# Patient Record
Sex: Female | Born: 1995 | Race: White | Hispanic: No | Marital: Single | Smoking: Current every day smoker
Health system: Southern US, Community
[De-identification: ages and names within clinical notes are randomized; demographics above are authoritative.]

## PROBLEM LIST (undated history)

## (undated) DIAGNOSIS — F319 Bipolar disorder, unspecified: Secondary | ICD-10-CM

---

## 2016-10-20 ENCOUNTER — Emergency Department (HOSPITAL_COMMUNITY)
Admission: EM | Admit: 2016-10-20 | Discharge: 2016-10-21 | Disposition: A | Discharge status: Home / Self Care | Payer: Self-pay | Attending: Emergency Medicine | Admitting: Emergency Medicine

## 2016-10-20 ENCOUNTER — Emergency Department (HOSPITAL_COMMUNITY): Payer: Self-pay

## 2016-10-20 ENCOUNTER — Encounter (HOSPITAL_COMMUNITY): Payer: Self-pay | Admitting: Nurse Practitioner

## 2016-10-20 DIAGNOSIS — F17200 Nicotine dependence, unspecified, uncomplicated: Secondary | ICD-10-CM | POA: Insufficient documentation

## 2016-10-20 DIAGNOSIS — S0990XA Unspecified injury of head, initial encounter: Secondary | ICD-10-CM

## 2016-10-20 DIAGNOSIS — S098XXA Other specified injuries of head, initial encounter: Secondary | ICD-10-CM | POA: Insufficient documentation

## 2016-10-20 DIAGNOSIS — S0012XA Contusion of left eyelid and periocular area, initial encounter: Secondary | ICD-10-CM | POA: Insufficient documentation

## 2016-10-20 DIAGNOSIS — Y999 Unspecified external cause status: Secondary | ICD-10-CM | POA: Insufficient documentation

## 2016-10-20 DIAGNOSIS — M25512 Pain in left shoulder: Secondary | ICD-10-CM | POA: Insufficient documentation

## 2016-10-20 DIAGNOSIS — R0789 Other chest pain: Secondary | ICD-10-CM | POA: Insufficient documentation

## 2016-10-20 DIAGNOSIS — R1084 Generalized abdominal pain: Secondary | ICD-10-CM | POA: Insufficient documentation

## 2016-10-20 DIAGNOSIS — Y939 Activity, unspecified: Secondary | ICD-10-CM | POA: Insufficient documentation

## 2016-10-20 DIAGNOSIS — Y929 Unspecified place or not applicable: Secondary | ICD-10-CM | POA: Insufficient documentation

## 2016-10-20 DIAGNOSIS — M542 Cervicalgia: Secondary | ICD-10-CM | POA: Insufficient documentation

## 2016-10-20 DIAGNOSIS — M25511 Pain in right shoulder: Secondary | ICD-10-CM | POA: Insufficient documentation

## 2016-10-20 DIAGNOSIS — S0512XA Contusion of eyeball and orbital tissues, left eye, initial encounter: Secondary | ICD-10-CM

## 2016-10-20 DIAGNOSIS — R55 Syncope and collapse: Secondary | ICD-10-CM | POA: Insufficient documentation

## 2016-10-20 HISTORY — DX: Bipolar disorder, unspecified: F31.9

## 2016-10-20 MED ORDER — ONDANSETRON HCL 4 MG/2ML IJ SOLN
4.0000 mg | Freq: Once | INTRAMUSCULAR | Status: AC
  Administered 2016-10-21: 4 mg via INTRAVENOUS
  Filled 2016-10-20: qty 2

## 2016-10-20 MED ORDER — FENTANYL CITRATE (PF) 100 MCG/2ML IJ SOLN
50.0000 ug | Freq: Once | INTRAMUSCULAR | Status: AC
  Administered 2016-10-21: 50 ug via INTRAVENOUS
  Filled 2016-10-20: qty 2
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
## 2016-10-21 ENCOUNTER — Emergency Department (HOSPITAL_COMMUNITY): Payer: Self-pay

## 2016-10-21 LAB — CBC
HCT: 40.5 % (ref 36.0–46.0)
Hemoglobin: 13.7 g/dL (ref 12.0–15.0)
MCH: 29.5 pg (ref 26.0–34.0)
MCHC: 33.8 g/dL (ref 30.0–36.0)
MCV: 87.1 fL (ref 78.0–100.0)
PLATELETS: 238 10*3/uL (ref 150–400)
RBC: 4.65 MIL/uL (ref 3.87–5.11)
RDW: 13.4 % (ref 11.5–15.5)
WBC: 15.4 10*3/uL — AB (ref 4.0–10.5)

## 2016-10-21 LAB — COMPREHENSIVE METABOLIC PANEL
ALBUMIN: 4.6 g/dL (ref 3.5–5.0)
ALT: 18 U/L (ref 14–54)
AST: 27 U/L (ref 15–41)
Alkaline Phosphatase: 64 U/L (ref 38–126)
Anion gap: 15 (ref 5–15)
BUN: 9 mg/dL (ref 6–20)
CALCIUM: 8.9 mg/dL (ref 8.9–10.3)
CO2: 18 mmol/L — ABNORMAL LOW (ref 22–32)
CREATININE: 0.76 mg/dL (ref 0.44–1.00)
Chloride: 107 mmol/L (ref 101–111)
GFR calc Af Amer: >60 mL/min (ref >60)
GFR calc non Af Amer: >60 mL/min (ref >60)
Glucose, Bld: 90 mg/dL (ref 65–99)
Potassium: 3.5 mmol/L (ref 3.5–5.1)
SODIUM: 140 mmol/L (ref 135–145)
TOTAL PROTEIN: 7 g/dL (ref 6.5–8.1)
Total Bilirubin: 0.4 mg/dL (ref 0.3–1.2)

## 2016-10-21 LAB — URINALYSIS, ROUTINE W REFLEX MICROSCOPIC
BILIRUBIN URINE: NEGATIVE
Glucose, UA: NEGATIVE mg/dL
Hgb urine dipstick: NEGATIVE
KETONES UR: 5 mg/dL — AB
Leukocytes, UA: NEGATIVE
NITRITE: NEGATIVE
PH: 6 (ref 5.0–8.0)
PROTEIN: NEGATIVE mg/dL
Specific Gravity, Urine: 1.019 (ref 1.005–1.030)

## 2016-10-21 LAB — PROTIME-INR
INR: 1.03
Prothrombin Time: 13.5 seconds (ref 11.4–15.2)

## 2016-10-21 LAB — I-STAT CHEM 8, ED
BUN: 10 mg/dL (ref 6–20)
CALCIUM ION: 0.95 mmol/L — AB (ref 1.15–1.40)
Chloride: 111 mmol/L (ref 101–111)
Creatinine, Ser: 0.8 mg/dL (ref 0.44–1.00)
Glucose, Bld: 95 mg/dL (ref 65–99)
HCT: 41 % (ref 36.0–46.0)
HEMOGLOBIN: 13.9 g/dL (ref 12.0–15.0)
Potassium: 3.5 mmol/L (ref 3.5–5.1)
SODIUM: 143 mmol/L (ref 135–145)
TCO2: 19 mmol/L — AB (ref 22–32)

## 2016-10-21 LAB — I-STAT CG4 LACTIC ACID, ED
Lactic Acid, Venous: 1.84 mmol/L (ref 0.5–1.9)
Lactic Acid, Venous: 3.34 mmol/L (ref 0.5–1.9)

## 2016-10-21 LAB — I-STAT BETA HCG BLOOD, ED (MC, WL, AP ONLY)

## 2016-10-21 LAB — ETHANOL: ALCOHOL ETHYL (B): 55 mg/dL — AB (ref <5)

## 2016-10-21 MED ORDER — ONDANSETRON 4 MG PO TBDP: 4.0000 mg | ORAL_TABLET | Freq: Three times a day (TID) | ORAL | 0 refills | Status: AC | PRN

## 2016-10-21 MED ORDER — IOPAMIDOL (ISOVUE-300) INJECTION 61%
INTRAVENOUS | Status: AC
  Administered 2016-10-21: 100 mL
  Filled 2016-10-21: qty 100

## 2016-10-21 MED ORDER — IBUPROFEN 800 MG PO TABS: 800.0000 mg | ORAL_TABLET | Freq: Three times a day (TID) | ORAL | 0 refills | Status: AC

## 2016-10-21 MED ORDER — ONDANSETRON HCL 4 MG/2ML IJ SOLN
4.0000 mg | Freq: Once | INTRAMUSCULAR | Status: AC
  Administered 2016-10-21: 4 mg via INTRAVENOUS
  Filled 2016-10-21: qty 2

## 2016-10-21 MED ORDER — SODIUM CHLORIDE 0.9 % IV BOLUS (SEPSIS)
1000.0000 mL | Freq: Once | INTRAVENOUS | Status: AC
  Administered 2016-10-21: 1000 mL via INTRAVENOUS

## 2016-10-21 NOTE — ED Notes (Signed)
CT contrast clicked off in error.

## 2016-10-21 NOTE — ED Notes (Signed)
Pt ambulatory to restroom. Pt given wheelchair halfway through walking.

## 2016-10-21 NOTE — ED Notes (Signed)
Nurse collected labs. 

## 2016-10-21 NOTE — ED Notes (Signed)
Patient transported to CT 
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
## 2016-10-21 NOTE — Discharge Instructions (Signed)
You have no internal injuries or fractures.  Please follow-up with your doctor in 1 week.  You may apply ice and use ibuprofen and tylenol as needed.

## 2016-10-22 LAB — SAMPLE TO BLOOD BANK
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
